# Patient Record
Sex: Male | Born: 1970 | Hispanic: No
Health system: Southern US, Community
[De-identification: ages and names within clinical notes are randomized; demographics above are authoritative.]

---

## 2003-01-03 ENCOUNTER — Emergency Department (HOSPITAL_COMMUNITY): Admission: EM | Admit: 2003-01-03 | Discharge: 2003-01-03 | Payer: Self-pay | Admitting: Emergency Medicine

## 2012-12-17 ENCOUNTER — Other Ambulatory Visit: Payer: Self-pay | Admitting: Internal Medicine

## 2012-12-17 DIAGNOSIS — R1906 Epigastric swelling, mass or lump: Secondary | ICD-10-CM

## 2012-12-29 ENCOUNTER — Ambulatory Visit
Admission: RE | Admit: 2012-12-29 | Discharge: 2012-12-29 | Disposition: A | Discharge status: Home / Self Care | Payer: BC Managed Care – PPO | Source: Ambulatory Visit | Attending: Internal Medicine | Admitting: Internal Medicine

## 2012-12-29 DIAGNOSIS — R1906 Epigastric swelling, mass or lump: Secondary | ICD-10-CM

## 2012-12-29 MED ORDER — IOHEXOL 300 MG/ML  SOLN
40.0000 mL | Freq: Once | INTRAMUSCULAR | Status: AC | PRN
  Administered 2012-12-29: 40 mL via ORAL

## 2012-12-29 MED ORDER — IOHEXOL 300 MG/ML  SOLN
125.0000 mL | Freq: Once | INTRAMUSCULAR | Status: AC | PRN
  Administered 2012-12-29: 125 mL via INTRAVENOUS

## 2013-09-13 ENCOUNTER — Other Ambulatory Visit: Payer: Self-pay | Admitting: Internal Medicine

## 2013-09-13 DIAGNOSIS — R51 Headache: Principal | ICD-10-CM

## 2013-09-13 DIAGNOSIS — R519 Headache, unspecified: Secondary | ICD-10-CM

## 2013-09-15 ENCOUNTER — Inpatient Hospital Stay: Admission: RE | Admit: 2013-09-15 | Payer: BC Managed Care – PPO | Source: Ambulatory Visit

## 2014-05-08 IMAGING — CT CT ABDOMEN W/ CM
3 of 4 series · 13 of 36 positions shown, 19 images · IV contrast (OMNI 300/WATER & [ID] OMNI 300)
Comparison: None

CLINICAL DATA: Upper abdominal pain and swelling.  Palpable knot
just below the xyphoid process.

CT ABDOMEN WITH CONTRAST
TECHNIQUE: Multidetector CT imaging of the abdomen was performed
following the standard protocol during bolus administration of
intravenous contrast.
Contrast: 40mL OMNIPAQUE IOHEXOL 300 MG/ML  SOLN, 125mL OMNIPAQUE
IOHEXOL 300 MG/ML  SOLN

[Series 3: abdomen with · axial · 0.74mm/px · z∈[-293,-68]mm · 6 of 64 slices shown, 11 images]
[im 10/64  soft-tissue]
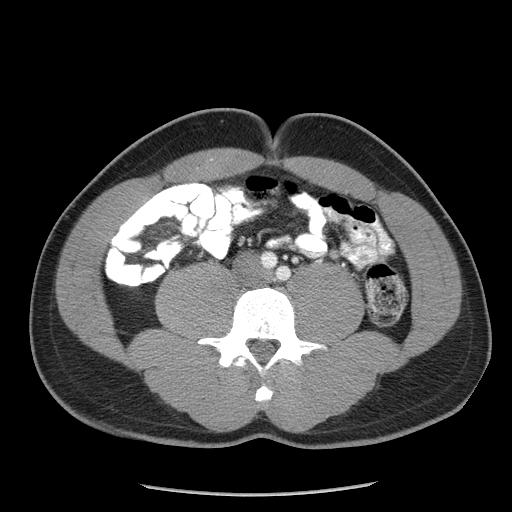
[im 10/64  bone]
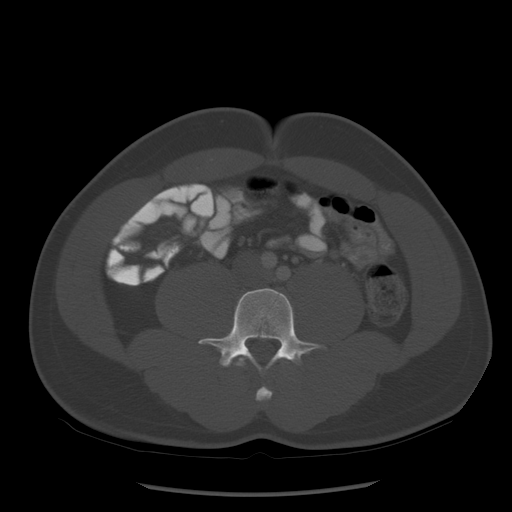
[im 19/64  soft-tissue]
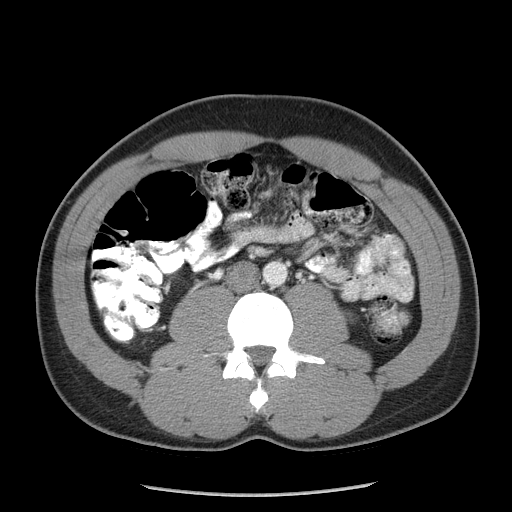
[im 28/64  soft-tissue]
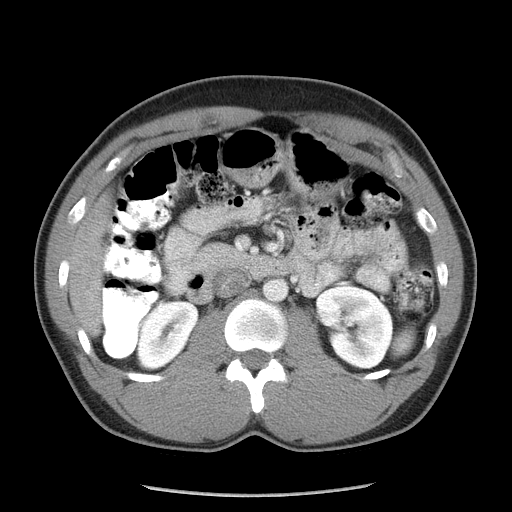
[im 28/64  lung]
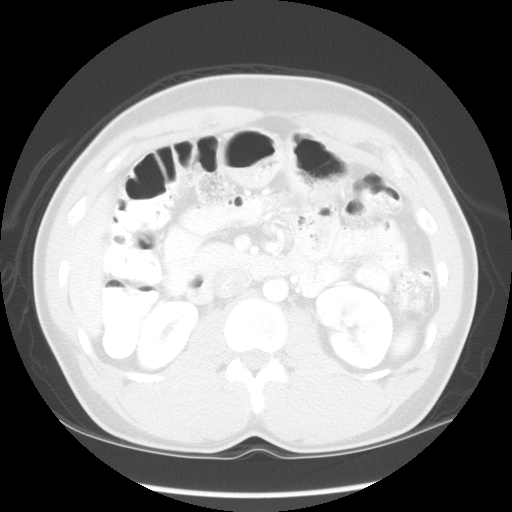
[im 37/64  soft-tissue]
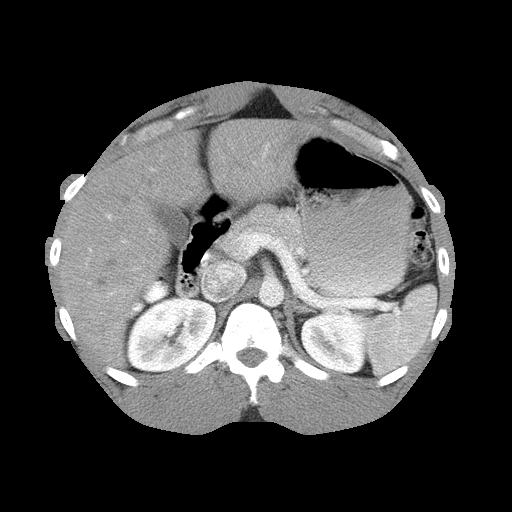
[im 37/64  lung]
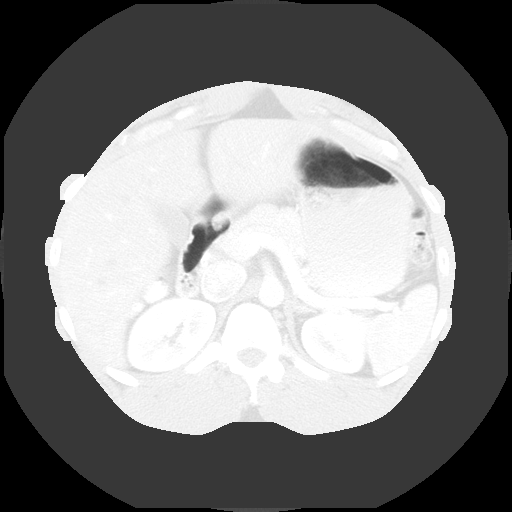
[im 46/64  soft-tissue]
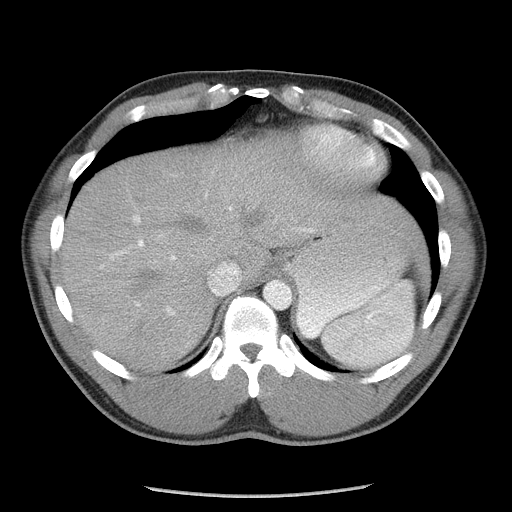
[im 46/64  lung]
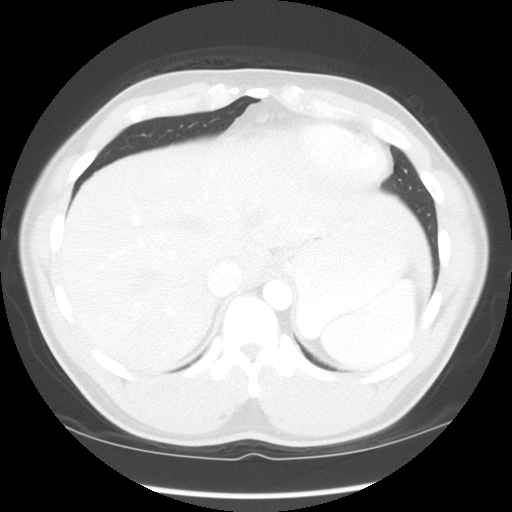
[im 55/64  soft-tissue]
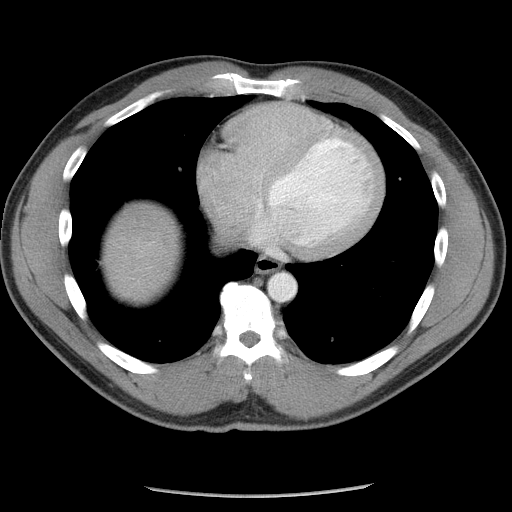
[im 55/64  lung]
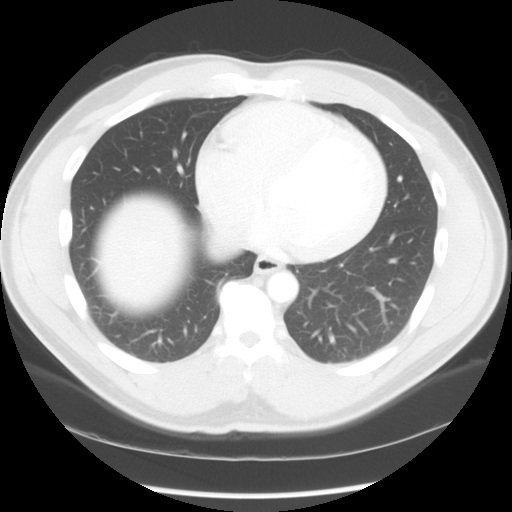

[Series 601: coronal body · coronal · 0.74mm/px · 1 of 125 slices shown, 2 images]
[im 42/125  soft-tissue]
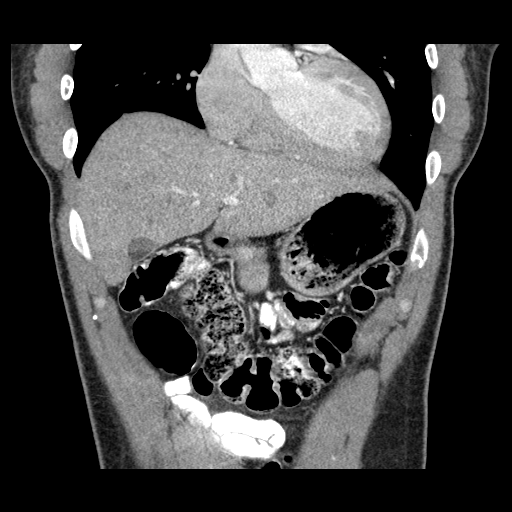
[im 42/125  bone]
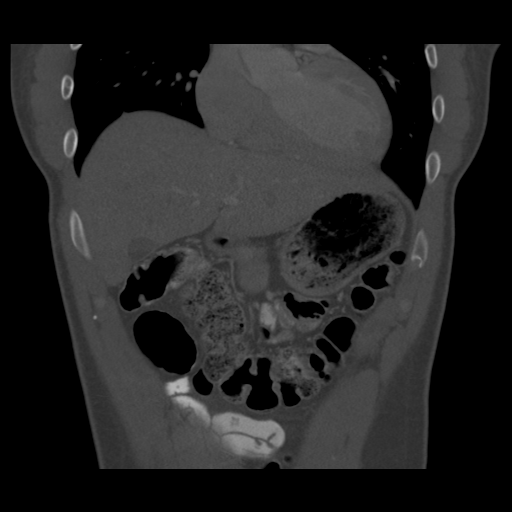

[Series 602: sagittal body · sagittal · 0.74mm/px · 6 of 153 slices shown]
[im 17/153  soft-tissue]
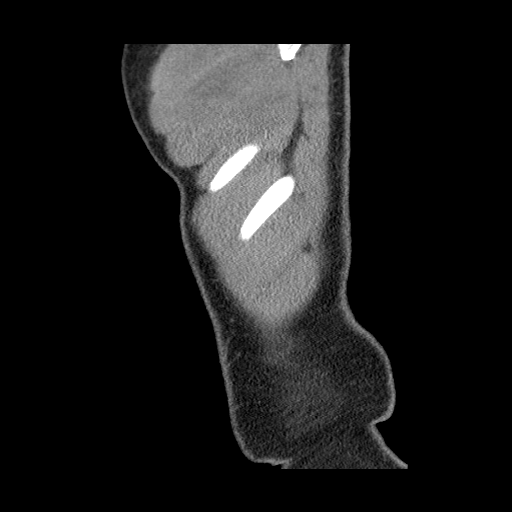
[im 33/153  soft-tissue]
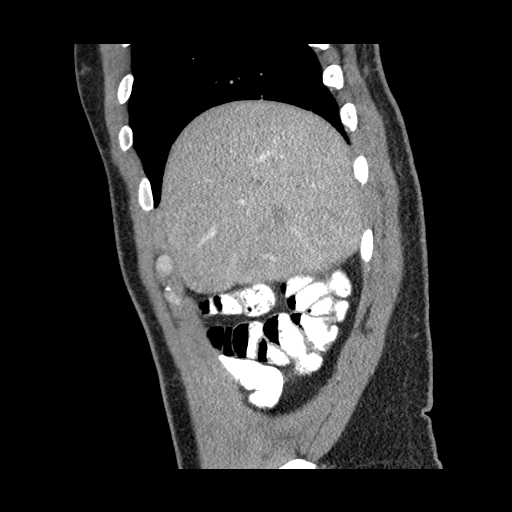
[im 49/153  soft-tissue]
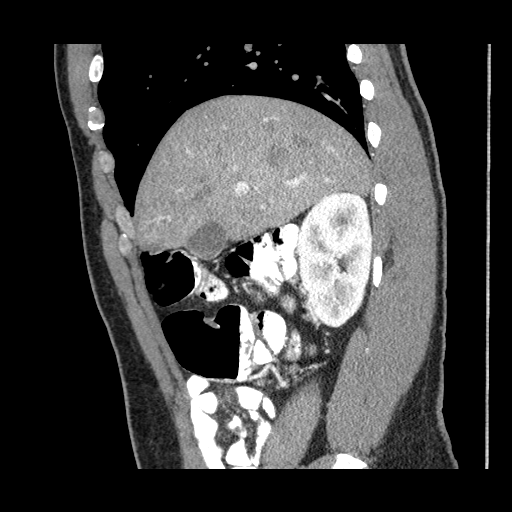
[im 65/153  soft-tissue]
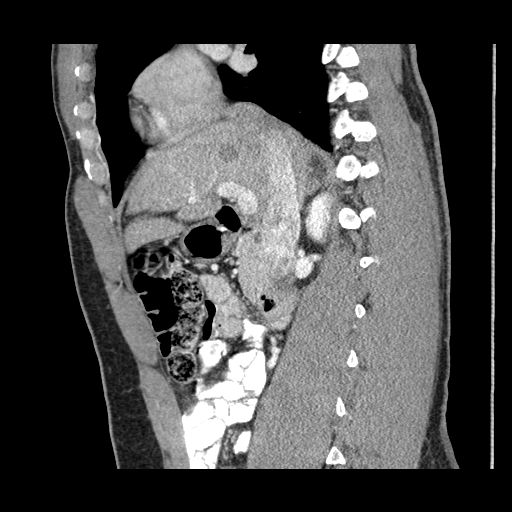
[im 89/153  soft-tissue]
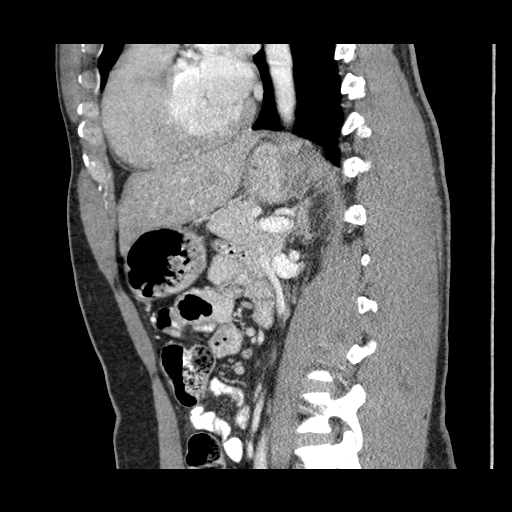
[im 105/153  soft-tissue]
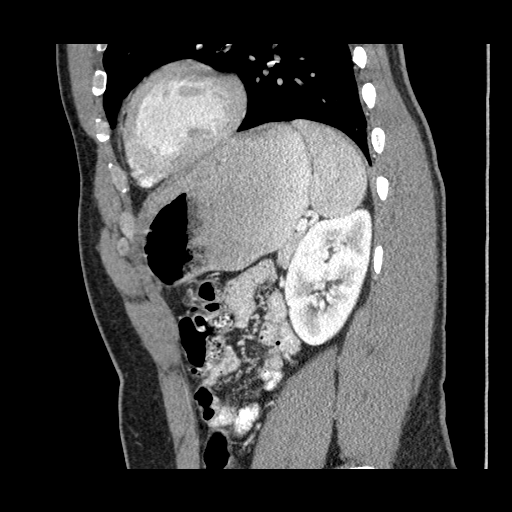

[13 of 36 positions shown; findings below may reference images not displayed]

FINDINGS: The lung bases are clear.  The heart is normal in size.
No pericardial effusion.  The distal esophagus is grossly normal.
The aorta is normal in caliber.  No dissection.

The patients palpable abnormality corresponds to anterior bowing of
the tip of the xyphoid which is slightly asymmetric right.  This is
pushing the rectus abdominal muscle anteriorly and this correlates
with the patient's palpable abnormality.  The thoracic and upper
lumbar vertebral bodies are normal.

The solid abdominal organs are normal.  The gallbladder is normal.
No common bile duct dilatation.  The stomach, duodenum, visualized
small bowel and visualized colon are unremarkable.  No mesenteric
or retroperitoneal mass or adenopathy.  The aorta is normal in
caliber.
IMPRESSION: The patients palpable abnormality corresponds to a normal variant
of anatomy with ventral bowing of the elongated xyphoid process
which is slightly asymmetric right and deviates the right rectus
muscle ventrally.
